# Patient Record
Sex: Female | Born: 1955 | Race: White | Hispanic: No | Marital: Single | Smoking: Never smoker
Health system: Southern US, Community
[De-identification: ages and names within clinical notes are randomized; demographics above are authoritative.]

## PROBLEM LIST (undated history)

## (undated) DIAGNOSIS — M1990 Unspecified osteoarthritis, unspecified site: Secondary | ICD-10-CM

## (undated) DIAGNOSIS — M8580 Other specified disorders of bone density and structure, unspecified site: Secondary | ICD-10-CM

## (undated) DIAGNOSIS — K635 Polyp of colon: Secondary | ICD-10-CM

## (undated) HISTORY — PX: CARPAL TUNNEL RELEASE: SHX101

## (undated) HISTORY — PX: AUGMENTATION MAMMAPLASTY: SUR837

## (undated) HISTORY — PX: FOOT SURGERY: SHX648

## (undated) HISTORY — DX: Other specified disorders of bone density and structure, unspecified site: M85.80

## (undated) HISTORY — PX: TONSILLECTOMY: SUR1361

## (undated) HISTORY — DX: Unspecified osteoarthritis, unspecified site: M19.90

## (undated) HISTORY — DX: Polyp of colon: K63.5

---

## 2002-12-20 ENCOUNTER — Encounter: Payer: Self-pay | Admitting: Internal Medicine

## 2002-12-20 ENCOUNTER — Encounter: Admission: RE | Admit: 2002-12-20 | Discharge: 2002-12-20 | Payer: Self-pay | Admitting: Internal Medicine

## 2003-07-27 ENCOUNTER — Encounter: Payer: Self-pay | Admitting: Internal Medicine

## 2003-07-27 ENCOUNTER — Encounter: Admission: RE | Admit: 2003-07-27 | Discharge: 2003-07-27 | Payer: Self-pay | Admitting: Internal Medicine

## 2004-08-15 ENCOUNTER — Encounter: Admission: RE | Admit: 2004-08-15 | Discharge: 2004-08-15 | Payer: Self-pay | Admitting: Internal Medicine

## 2004-12-19 ENCOUNTER — Other Ambulatory Visit: Admission: RE | Admit: 2004-12-19 | Discharge: 2004-12-19 | Payer: Self-pay | Admitting: Internal Medicine

## 2005-07-17 ENCOUNTER — Encounter: Admission: RE | Admit: 2005-07-17 | Discharge: 2005-07-17 | Payer: Self-pay | Admitting: Internal Medicine

## 2005-12-23 ENCOUNTER — Other Ambulatory Visit: Admission: RE | Admit: 2005-12-23 | Discharge: 2005-12-23 | Payer: Self-pay | Admitting: Internal Medicine

## 2006-07-21 ENCOUNTER — Encounter: Admission: RE | Admit: 2006-07-21 | Discharge: 2006-07-21 | Payer: Self-pay | Admitting: Internal Medicine

## 2007-07-23 ENCOUNTER — Encounter: Admission: RE | Admit: 2007-07-23 | Discharge: 2007-07-23 | Payer: Self-pay | Admitting: Internal Medicine

## 2008-07-24 ENCOUNTER — Ambulatory Visit (HOSPITAL_BASED_OUTPATIENT_CLINIC_OR_DEPARTMENT_OTHER): Admission: RE | Admit: 2008-07-24 | Discharge: 2008-07-24 | Payer: Self-pay | Admitting: Internal Medicine

## 2009-07-25 ENCOUNTER — Ambulatory Visit (HOSPITAL_BASED_OUTPATIENT_CLINIC_OR_DEPARTMENT_OTHER): Admission: RE | Admit: 2009-07-25 | Discharge: 2009-07-25 | Payer: Self-pay | Admitting: Internal Medicine

## 2009-07-25 ENCOUNTER — Ambulatory Visit: Payer: Self-pay | Admitting: Diagnostic Radiology

## 2010-07-29 ENCOUNTER — Ambulatory Visit (HOSPITAL_BASED_OUTPATIENT_CLINIC_OR_DEPARTMENT_OTHER): Admission: RE | Admit: 2010-07-29 | Discharge: 2010-07-29 | Payer: Self-pay | Admitting: Internal Medicine

## 2010-07-29 ENCOUNTER — Ambulatory Visit: Payer: Self-pay | Admitting: Diagnostic Radiology

## 2011-05-29 ENCOUNTER — Other Ambulatory Visit: Payer: Self-pay | Admitting: Internal Medicine

## 2011-05-29 DIAGNOSIS — Z1231 Encounter for screening mammogram for malignant neoplasm of breast: Secondary | ICD-10-CM

## 2011-07-31 ENCOUNTER — Ambulatory Visit (HOSPITAL_BASED_OUTPATIENT_CLINIC_OR_DEPARTMENT_OTHER)
Admission: RE | Admit: 2011-07-31 | Discharge: 2011-07-31 | Disposition: A | Discharge status: Home / Self Care | Payer: Commercial Managed Care - PPO | Source: Ambulatory Visit | Attending: Internal Medicine | Admitting: Internal Medicine

## 2011-07-31 DIAGNOSIS — Z1231 Encounter for screening mammogram for malignant neoplasm of breast: Secondary | ICD-10-CM | POA: Insufficient documentation

## 2012-05-28 ENCOUNTER — Ambulatory Visit: Payer: Commercial Managed Care - PPO | Admitting: Family Medicine

## 2012-06-30 ENCOUNTER — Other Ambulatory Visit: Payer: Self-pay | Admitting: Family Medicine

## 2012-06-30 DIAGNOSIS — Z1231 Encounter for screening mammogram for malignant neoplasm of breast: Secondary | ICD-10-CM

## 2012-07-05 ENCOUNTER — Other Ambulatory Visit (HOSPITAL_COMMUNITY)
Admission: RE | Admit: 2012-07-05 | Discharge: 2012-07-05 | Disposition: A | Discharge status: Home / Self Care | Payer: Commercial Managed Care - PPO | Source: Ambulatory Visit | Attending: Family Medicine | Admitting: Family Medicine

## 2012-07-05 ENCOUNTER — Encounter: Payer: Self-pay | Admitting: Family Medicine

## 2012-07-05 ENCOUNTER — Ambulatory Visit (INDEPENDENT_AMBULATORY_CARE_PROVIDER_SITE_OTHER): Payer: Commercial Managed Care - PPO | Admitting: Family Medicine

## 2012-07-05 VITALS — BP 100/62 | HR 71 | Temp 98.3°F | Ht 62.75 in | Wt 142.2 lb

## 2012-07-05 DIAGNOSIS — Z0000 Encounter for general adult medical examination without abnormal findings: Secondary | ICD-10-CM

## 2012-07-05 DIAGNOSIS — Z13220 Encounter for screening for lipoid disorders: Secondary | ICD-10-CM

## 2012-07-05 DIAGNOSIS — Z01419 Encounter for gynecological examination (general) (routine) without abnormal findings: Secondary | ICD-10-CM | POA: Insufficient documentation

## 2012-07-05 DIAGNOSIS — Z124 Encounter for screening for malignant neoplasm of cervix: Secondary | ICD-10-CM

## 2012-07-05 LAB — CBC WITH DIFFERENTIAL/PLATELET
Basophils Relative: 0.4 % (ref 0.0–3.0)
Eosinophils Absolute: 0.1 10*3/uL (ref 0.0–0.7)
Eosinophils Relative: 1.8 % (ref 0.0–5.0)
Lymphs Abs: 1.6 10*3/uL (ref 0.7–4.0)
MCHC: 33.4 g/dL (ref 30.0–36.0)
MCV: 94.1 fl (ref 78.0–100.0)
Monocytes Absolute: 0.3 10*3/uL (ref 0.1–1.0)
RBC: 4.24 Mil/uL (ref 3.87–5.11)

## 2012-07-05 LAB — POCT URINALYSIS DIPSTICK
Bilirubin, UA: NEGATIVE
Blood, UA: NEGATIVE
Protein, UA: NEGATIVE
Urobilinogen, UA: 0.2

## 2012-07-05 NOTE — Progress Notes (Signed)
Subjective:     Jaime Mitchell is a 56 y.o. female and is here for a comprehensive physical exam. The patient reports no problems.  History   Social History  . Marital Status: Single    Spouse Name: N/A    Number of Children: N/A  . Years of Education: N/A   Occupational History  . medical manager     [employer]   Social History Main Topics  . Smoking status: Never Smoker   . Smokeless tobacco: Never Used  . Alcohol Use: Yes     occ  . Drug Use: No  . Sexually Active: No   Other Topics Concern  . Not on file   Social History Narrative   Exercise-- 4x a week for 1 hour   Health Maintenance  Topic Date Due  . Influenza Vaccine  06/13/2012  . Mammogram  07/30/2013  . Colonoscopy  07/05/2014  . Pap Smear  07/06/2015  . Tetanus/tdap  05/13/2021    The following portions of the patient's history were reviewed and updated as appropriate: She  has a past medical history of Colon polyp; Osteopenia; and Arthritis. She  does not have a problem list on file. She  has past surgical history that includes Cesarean section; Carpal tunnel release; Tonsillectomy; and Foot surgery. Her family history includes Breast cancer in her sister; COPD in her father; Emphysema in her father; Heart disease (age of onset:89) in her mother; and Uterine cancer in her sister. She  reports that she has never smoked. She has never used smokeless tobacco. She reports that she drinks alcohol. She reports that she does not use illicit drugs. She has a current medication list which includes the following prescription(s): risedronate sodium. She is allergic to penicillins..  Review of Systems Review of Systems  Constitutional: Negative for activity change, appetite change and fatigue.  HENT: Negative for hearing loss, congestion, tinnitus and ear discharge.  dentist q58m Eyes: Negative for visual disturbance (see optho q1y -- vision corrected to 20/20 with glasses).  Respiratory: Negative for cough, chest  tightness and shortness of breath.   Cardiovascular: Negative for chest pain, palpitations and leg swelling.  Gastrointestinal: Negative for abdominal pain, diarrhea, constipation and abdominal distention.  Genitourinary: Negative for urgency, frequency, decreased urine volume and difficulty urinating.  Musculoskeletal: Negative for back pain, arthralgias and gait problem.  Skin: Negative for color change, pallor and rash.  Neurological: Negative for dizziness, light-headedness, numbness and headaches.  Hematological: Negative for adenopathy. Does not bruise/bleed easily.  Psychiatric/Behavioral: Negative for suicidal ideas, confusion, sleep disturbance, self-injury, dysphoric mood, decreased concentration and agitation.       Objective:    BP 100/62  Pulse 71  Temp 98.3 F (36.8 C) (Oral)  Ht 5' 2.75" (1.594 m)  Wt 142 lb 3.2 oz (64.501 kg)  BMI 25.39 kg/m2  SpO2 96% General appearance: alert, cooperative, appears stated age and no distress Head: Normocephalic, without obvious abnormality, atraumatic Eyes: conjunctivae/corneas clear. PERRL, EOM's intact. Fundi benign. Ears: normal TM's and external ear canals both ears Nose: Nares normal. Septum midline. Mucosa normal. No drainage or sinus tenderness. Throat: lips, mucosa, and tongue normal; teeth and gums normal Neck: no adenopathy, no carotid bruit, no JVD, supple, symmetrical, trachea midline and thyroid not enlarged, symmetric, no tenderness/mass/nodules Back: symmetric, no curvature. ROM normal. No CVA tenderness. Lungs: clear to auscultation bilaterally Breasts: normal appearance, no masses or tenderness--+ implants Heart: regular rate and rhythm, S1, S2 normal, no murmur, click, rub or gallop Abdomen:  soft, non-tender; bowel sounds normal; no masses,  no organomegaly Pelvic: cervix normal in appearance, no adnexal masses or tenderness, no cervical motion tenderness, rectovaginal septum normal, uterus normal size, shape,  and consistency and vagina normal without discharge---+ scratch on ext genitalia on right side--- pt noticed tender area Extremities: extremities normal, atraumatic, no cyanosis or edema Pulses: 2+ and symmetric Skin: Skin color, texture, turgor normal. No rashes or lesions---toenail an4th fingernail on both hands --lifting  Lymph nodes: Cervical, supraclavicular, and axillary nodes normal. Neurologic: Alert and oriented X 3, normal strength and tone. Normal symmetric reflexes. Normal coordination and gait psych--  no depression,  anxiety    Assessment:    Healthy female exam.      Plan:    check labs Ghm utd See After Visit Summary for Counseling Recommendations

## 2012-07-05 NOTE — Patient Instructions (Addendum)
Preventive Care for Adults, Female A healthy lifestyle and preventive care can promote health and wellness. Preventive health guidelines for women include the following key practices.  A routine yearly physical is a good way to check with your caregiver about your health and preventive screening. It is a chance to share any concerns and updates on your health, and to receive a thorough exam.   Visit your dentist for a routine exam and preventive care every 6 months. Brush your teeth twice a day and floss once a day. Good oral hygiene prevents tooth decay and gum disease.   The frequency of eye exams is based on your age, health, family medical history, use of contact lenses, and other factors. Follow your caregiver's recommendations for frequency of eye exams.   Eat a healthy diet. Foods like vegetables, fruits, whole grains, low-fat dairy products, and lean protein foods contain the nutrients you need without too many calories. Decrease your intake of foods high in solid fats, added sugars, and salt. Eat the right amount of calories for you.Get information about a proper diet from your caregiver, if necessary.   Regular physical exercise is one of the most important things you can do for your health. Most adults should get at least 150 minutes of moderate-intensity exercise (any activity that increases your heart rate and causes you to sweat) each week. In addition, most adults need muscle-strengthening exercises on 2 or more days a week.   Maintain a healthy weight. The body mass index (BMI) is a screening tool to identify possible weight problems. It provides an estimate of body fat based on height and weight. Your caregiver can help determine your BMI, and can help you achieve or maintain a healthy weight.For adults 20 years and older:   A BMI below 18.5 is considered underweight.   A BMI of 18.5 to 24.9 is normal.   A BMI of 25 to 29.9 is considered overweight.   A BMI of 30 and above is  considered obese.   Maintain normal blood lipids and cholesterol levels by exercising and minimizing your intake of saturated fat. Eat a balanced diet with plenty of fruit and vegetables. Blood tests for lipids and cholesterol should begin at age 20 and be repeated every 5 years. If your lipid or cholesterol levels are high, you are over 50, or you are at high risk for heart disease, you may need your cholesterol levels checked more frequently.Ongoing high lipid and cholesterol levels should be treated with medicines if diet and exercise are not effective.   If you smoke, find out from your caregiver how to quit. If you do not use tobacco, do not start.   If you are pregnant, do not drink alcohol. If you are breastfeeding, be very cautious about drinking alcohol. If you are not pregnant and choose to drink alcohol, do not exceed 1 drink per day. One drink is considered to be 12 ounces (355 mL) of beer, 5 ounces (148 mL) of wine, or 1.5 ounces (44 mL) of liquor.   Avoid use of street drugs. Do not share needles with anyone. Ask for help if you need support or instructions about stopping the use of drugs.   High blood pressure causes heart disease and increases the risk of stroke. Your blood pressure should be checked at least every 1 to 2 years. Ongoing high blood pressure should be treated with medicines if weight loss and exercise are not effective.   If you are 55 to 57   years old, ask your caregiver if you should take aspirin to prevent strokes.   Diabetes screening involves taking a blood sample to check your fasting blood sugar level. This should be done once every 3 years, after age 45, if you are within normal weight and without risk factors for diabetes. Testing should be considered at a younger age or be carried out more frequently if you are overweight and have at least 1 risk factor for diabetes.   Breast cancer screening is essential preventive care for women. You should practice "breast  self-awareness." This means understanding the normal appearance and feel of your breasts and may include breast self-examination. Any changes detected, no matter how small, should be reported to a caregiver. Women in their 20s and 30s should have a clinical breast exam (CBE) by a caregiver as part of a regular health exam every 1 to 3 years. After age 40, women should have a CBE every year. Starting at age 40, women should consider having a mammography (breast X-ray test) every year. Women who have a family history of breast cancer should talk to their caregiver about genetic screening. Women at a high risk of breast cancer should talk to their caregivers about having magnetic resonance imaging (MRI) and a mammography every year.   The Pap test is a screening test for cervical cancer. A Pap test can show cell changes on the cervix that might become cervical cancer if left untreated. A Pap test is a procedure in which cells are obtained and examined from the lower end of the uterus (cervix).   Women should have a Pap test starting at age 21.   Between ages 21 and 29, Pap tests should be repeated every 2 years.   Beginning at age 30, you should have a Pap test every 3 years as long as the past 3 Pap tests have been normal.   Some women have medical problems that increase the chance of getting cervical cancer. Talk to your caregiver about these problems. It is especially important to talk to your caregiver if a new problem develops soon after your last Pap test. In these cases, your caregiver may recommend more frequent screening and Pap tests.   The above recommendations are the same for women who have or have not gotten the vaccine for human papillomavirus (HPV).   If you had a hysterectomy for a problem that was not cancer or a condition that could lead to cancer, then you no longer need Pap tests. Even if you no longer need a Pap test, a regular exam is a good idea to make sure no other problems are  starting.   If you are between ages 65 and 70, and you have had normal Pap tests going back 10 years, you no longer need Pap tests. Even if you no longer need a Pap test, a regular exam is a good idea to make sure no other problems are starting.   If you have had past treatment for cervical cancer or a condition that could lead to cancer, you need Pap tests and screening for cancer for at least 20 years after your treatment.   If Pap tests have been discontinued, risk factors (such as a new sexual partner) need to be reassessed to determine if screening should be resumed.   The HPV test is an additional test that may be used for cervical cancer screening. The HPV test looks for the virus that can cause the cell changes on the cervix.   The cells collected during the Pap test can be tested for HPV. The HPV test could be used to screen women aged 30 years and older, and should be used in women of any age who have unclear Pap test results. After the age of 30, women should have HPV testing at the same frequency as a Pap test.   Colorectal cancer can be detected and often prevented. Most routine colorectal cancer screening begins at the age of 50 and continues through age 75. However, your caregiver may recommend screening at an earlier age if you have risk factors for colon cancer. On a yearly basis, your caregiver may provide home test kits to check for hidden blood in the stool. Use of a small camera at the end of a tube, to directly examine the colon (sigmoidoscopy or colonoscopy), can detect the earliest forms of colorectal cancer. Talk to your caregiver about this at age 50, when routine screening begins. Direct examination of the colon should be repeated every 5 to 10 years through age 75, unless early forms of pre-cancerous polyps or small growths are found.   Hepatitis C blood testing is recommended for all people born from 1945 through 1965 and any individual with known risks for hepatitis C.    Practice safe sex. Use condoms and avoid high-risk sexual practices to reduce the spread of sexually transmitted infections (STIs). STIs include gonorrhea, chlamydia, syphilis, trichomonas, herpes, HPV, and human immunodeficiency virus (HIV). Herpes, HIV, and HPV are viral illnesses that have no cure. They can result in disability, cancer, and death. Sexually active women aged 25 and younger should be checked for chlamydia. Older women with new or multiple partners should also be tested for chlamydia. Testing for other STIs is recommended if you are sexually active and at increased risk.   Osteoporosis is a disease in which the bones lose minerals and strength with aging. This can result in serious bone fractures. The risk of osteoporosis can be identified using a bone density scan. Women ages 65 and over and women at risk for fractures or osteoporosis should discuss screening with their caregivers. Ask your caregiver whether you should take a calcium supplement or vitamin D to reduce the rate of osteoporosis.   Menopause can be associated with physical symptoms and risks. Hormone replacement therapy is available to decrease symptoms and risks. You should talk to your caregiver about whether hormone replacement therapy is right for you.   Use sunscreen with sun protection factor (SPF) of 30 or more. Apply sunscreen liberally and repeatedly throughout the day. You should seek shade when your shadow is shorter than you. Protect yourself by wearing long sleeves, pants, a wide-brimmed hat, and sunglasses year round, whenever you are outdoors.   Once a month, do a whole body skin exam, using a mirror to look at the skin on your back. Notify your caregiver of new moles, moles that have irregular borders, moles that are larger than a pencil eraser, or moles that have changed in shape or color.   Stay current with required immunizations.   Influenza. You need a dose every fall (or winter). The composition of  the flu vaccine changes each year, so being vaccinated once is not enough.   Pneumococcal polysaccharide. You need 1 to 2 doses if you smoke cigarettes or if you have certain chronic medical conditions. You need 1 dose at age 65 (or older) if you have never been vaccinated.   Tetanus, diphtheria, pertussis (Tdap, Td). Get 1 dose of   Tdap vaccine if you are younger than age 65, are over 65 and have contact with an infant, are a healthcare worker, are pregnant, or simply want to be protected from whooping cough. After that, you need a Td booster dose every 10 years. Consult your caregiver if you have not had at least 3 tetanus and diphtheria-containing shots sometime in your life or have a deep or dirty wound.   HPV. You need this vaccine if you are a woman age 26 or younger. The vaccine is given in 3 doses over 6 months.   Measles, mumps, rubella (MMR). You need at least 1 dose of MMR if you were born in 1957 or later. You may also need a second dose.   Meningococcal. If you are age 19 to 21 and a first-year college student living in a residence hall, or have one of several medical conditions, you need to get vaccinated against meningococcal disease. You may also need additional booster doses.   Zoster (shingles). If you are age 60 or older, you should get this vaccine.   Varicella (chickenpox). If you have never had chickenpox or you were vaccinated but received only 1 dose, talk to your caregiver to find out if you need this vaccine.   Hepatitis A. You need this vaccine if you have a specific risk factor for hepatitis A virus infection or you simply wish to be protected from this disease. The vaccine is usually given as 2 doses, 6 to 18 months apart.   Hepatitis B. You need this vaccine if you have a specific risk factor for hepatitis B virus infection or you simply wish to be protected from this disease. The vaccine is given in 3 doses, usually over 6 months.  Preventive Services /  Frequency Ages 19 to 39  Blood pressure check.** / Every 1 to 2 years.   Lipid and cholesterol check.** / Every 5 years beginning at age 20.   Clinical breast exam.** / Every 3 years for women in their 20s and 30s.   Pap test.** / Every 2 years from ages 21 through 29. Every 3 years starting at age 30 through age 65 or 70 with a history of 3 consecutive normal Pap tests.   HPV screening.** / Every 3 years from ages 30 through ages 65 to 70 with a history of 3 consecutive normal Pap tests.   Hepatitis C blood test.** / For any individual with known risks for hepatitis C.   Skin self-exam. / Monthly.   Influenza immunization.** / Every year.   Pneumococcal polysaccharide immunization.** / 1 to 2 doses if you smoke cigarettes or if you have certain chronic medical conditions.   Tetanus, diphtheria, pertussis (Tdap, Td) immunization. / A one-time dose of Tdap vaccine. After that, you need a Td booster dose every 10 years.   HPV immunization. / 3 doses over 6 months, if you are 26 and younger.   Measles, mumps, rubella (MMR) immunization. / You need at least 1 dose of MMR if you were born in 1957 or later. You may also need a second dose.   Meningococcal immunization. / 1 dose if you are age 19 to 21 and a first-year college student living in a residence hall, or have one of several medical conditions, you need to get vaccinated against meningococcal disease. You may also need additional booster doses.   Varicella immunization.** / Consult your caregiver.   Hepatitis A immunization.** / Consult your caregiver. 2 doses, 6 to 18 months   apart.   Hepatitis B immunization.** / Consult your caregiver. 3 doses usually over 6 months.  Ages 40 to 64  Blood pressure check.** / Every 1 to 2 years.   Lipid and cholesterol check.** / Every 5 years beginning at age 20.   Clinical breast exam.** / Every year after age 40.   Mammogram.** / Every year beginning at age 40 and continuing for as  long as you are in good health. Consult with your caregiver.   Pap test.** / Every 3 years starting at age 30 through age 65 or 70 with a history of 3 consecutive normal Pap tests.   HPV screening.** / Every 3 years from ages 30 through ages 65 to 70 with a history of 3 consecutive normal Pap tests.   Fecal occult blood test (FOBT) of stool. / Every year beginning at age 50 and continuing until age 75. You may not need to do this test if you get a colonoscopy every 10 years.   Flexible sigmoidoscopy or colonoscopy.** / Every 5 years for a flexible sigmoidoscopy or every 10 years for a colonoscopy beginning at age 50 and continuing until age 75.   Hepatitis C blood test.** / For all people born from 1945 through 1965 and any individual with known risks for hepatitis C.   Skin self-exam. / Monthly.   Influenza immunization.** / Every year.   Pneumococcal polysaccharide immunization.** / 1 to 2 doses if you smoke cigarettes or if you have certain chronic medical conditions.   Tetanus, diphtheria, pertussis (Tdap, Td) immunization.** / A one-time dose of Tdap vaccine. After that, you need a Td booster dose every 10 years.   Measles, mumps, rubella (MMR) immunization. / You need at least 1 dose of MMR if you were born in 1957 or later. You may also need a second dose.   Varicella immunization.** / Consult your caregiver.   Meningococcal immunization.** / Consult your caregiver.   Hepatitis A immunization.** / Consult your caregiver. 2 doses, 6 to 18 months apart.   Hepatitis B immunization.** / Consult your caregiver. 3 doses, usually over 6 months.  Ages 65 and over  Blood pressure check.** / Every 1 to 2 years.   Lipid and cholesterol check.** / Every 5 years beginning at age 20.   Clinical breast exam.** / Every year after age 40.   Mammogram.** / Every year beginning at age 40 and continuing for as long as you are in good health. Consult with your caregiver.   Pap test.** /  Every 3 years starting at age 30 through age 65 or 70 with a 3 consecutive normal Pap tests. Testing can be stopped between 65 and 70 with 3 consecutive normal Pap tests and no abnormal Pap or HPV tests in the past 10 years.   HPV screening.** / Every 3 years from ages 30 through ages 65 or 70 with a history of 3 consecutive normal Pap tests. Testing can be stopped between 65 and 70 with 3 consecutive normal Pap tests and no abnormal Pap or HPV tests in the past 10 years.   Fecal occult blood test (FOBT) of stool. / Every year beginning at age 50 and continuing until age 75. You may not need to do this test if you get a colonoscopy every 10 years.   Flexible sigmoidoscopy or colonoscopy.** / Every 5 years for a flexible sigmoidoscopy or every 10 years for a colonoscopy beginning at age 50 and continuing until age 75.   Hepatitis   C blood test.** / For all people born from 1945 through 1965 and any individual with known risks for hepatitis C.   Osteoporosis screening.** / A one-time screening for women ages 65 and over and women at risk for fractures or osteoporosis.   Skin self-exam. / Monthly.   Influenza immunization.** / Every year.   Pneumococcal polysaccharide immunization.** / 1 dose at age 65 (or older) if you have never been vaccinated.   Tetanus, diphtheria, pertussis (Tdap, Td) immunization. / A one-time dose of Tdap vaccine if you are over 65 and have contact with an infant, are a healthcare worker, or simply want to be protected from whooping cough. After that, you need a Td booster dose every 10 years.   Varicella immunization.** / Consult your caregiver.   Meningococcal immunization.** / Consult your caregiver.   Hepatitis A immunization.** / Consult your caregiver. 2 doses, 6 to 18 months apart.   Hepatitis B immunization.** / Check with your caregiver. 3 doses, usually over 6 months.  ** Family history and personal history of risk and conditions may change your caregiver's  recommendations. Document Released: 11/25/2001 Document Revised: 09/18/2011 Document Reviewed: 02/24/2011 [employer] Patient Information 2012 [employer]. 

## 2012-07-06 LAB — HEPATIC FUNCTION PANEL
ALT: 18 U/L (ref 0–35)
AST: 20 U/L (ref 0–37)
Bilirubin, Direct: 0.1 mg/dL (ref 0.0–0.3)
Total Protein: 7.2 g/dL (ref 6.0–8.3)

## 2012-07-06 LAB — BASIC METABOLIC PANEL
CO2: 28 mEq/L (ref 19–32)
Creatinine, Ser: 0.7 mg/dL (ref 0.4–1.2)
Potassium: 3.8 mEq/L (ref 3.5–5.1)
Sodium: 139 mEq/L (ref 135–145)

## 2012-07-06 LAB — LIPID PANEL
Cholesterol: 256 mg/dL — ABNORMAL HIGH (ref 0–200)
Triglycerides: 51 mg/dL (ref 0.0–149.0)

## 2012-07-06 LAB — LDL CHOLESTEROL, DIRECT: Direct LDL: 128.8 mg/dL

## 2012-08-02 ENCOUNTER — Ambulatory Visit (HOSPITAL_BASED_OUTPATIENT_CLINIC_OR_DEPARTMENT_OTHER)
Admission: RE | Admit: 2012-08-02 | Discharge: 2012-08-02 | Disposition: A | Discharge status: Home / Self Care | Payer: Commercial Managed Care - PPO | Source: Ambulatory Visit | Attending: Family Medicine | Admitting: Family Medicine

## 2012-08-02 DIAGNOSIS — Z1231 Encounter for screening mammogram for malignant neoplasm of breast: Secondary | ICD-10-CM | POA: Insufficient documentation

## 2012-11-04 ENCOUNTER — Encounter: Payer: Self-pay | Admitting: Family Medicine

## 2012-11-27 ENCOUNTER — Other Ambulatory Visit: Payer: Self-pay

## 2012-12-03 ENCOUNTER — Encounter: Payer: Self-pay | Admitting: Family Medicine

## 2013-05-09 ENCOUNTER — Encounter: Payer: Self-pay | Admitting: Family Medicine

## 2013-05-11 ENCOUNTER — Other Ambulatory Visit: Payer: Self-pay | Admitting: Family Medicine

## 2013-05-11 DIAGNOSIS — Z1231 Encounter for screening mammogram for malignant neoplasm of breast: Secondary | ICD-10-CM

## 2013-08-03 ENCOUNTER — Ambulatory Visit (HOSPITAL_BASED_OUTPATIENT_CLINIC_OR_DEPARTMENT_OTHER)
Admission: RE | Admit: 2013-08-03 | Discharge: 2013-08-03 | Disposition: A | Discharge status: Home / Self Care | Payer: Commercial Managed Care - PPO | Source: Ambulatory Visit | Attending: Family Medicine | Admitting: Family Medicine

## 2013-08-03 DIAGNOSIS — Z1231 Encounter for screening mammogram for malignant neoplasm of breast: Secondary | ICD-10-CM

## 2013-08-18 ENCOUNTER — Other Ambulatory Visit: Payer: Self-pay

## 2014-03-31 ENCOUNTER — Telehealth: Payer: Self-pay

## 2014-03-31 NOTE — Telephone Encounter (Addendum)
No answer.  Unable to leave a voice message.  No voice mail box setup.  Work and mobile numbers listed are invalid.

## 2014-04-03 ENCOUNTER — Ambulatory Visit (INDEPENDENT_AMBULATORY_CARE_PROVIDER_SITE_OTHER): Payer: PRIVATE HEALTH INSURANCE | Admitting: Family Medicine

## 2014-04-03 ENCOUNTER — Encounter: Payer: Self-pay | Admitting: Family Medicine

## 2014-04-03 ENCOUNTER — Other Ambulatory Visit (HOSPITAL_COMMUNITY)
Admission: RE | Admit: 2014-04-03 | Discharge: 2014-04-03 | Disposition: A | Discharge status: Home / Self Care | Payer: PRIVATE HEALTH INSURANCE | Source: Ambulatory Visit | Attending: Family Medicine | Admitting: Family Medicine

## 2014-04-03 VITALS — BP 100/68 | HR 67 | Temp 97.5°F | Ht 62.75 in | Wt 146.0 lb

## 2014-04-03 DIAGNOSIS — Z124 Encounter for screening for malignant neoplasm of cervix: Secondary | ICD-10-CM

## 2014-04-03 DIAGNOSIS — R809 Proteinuria, unspecified: Secondary | ICD-10-CM

## 2014-04-03 DIAGNOSIS — Z01419 Encounter for gynecological examination (general) (routine) without abnormal findings: Secondary | ICD-10-CM | POA: Insufficient documentation

## 2014-04-03 DIAGNOSIS — Z0000 Encounter for general adult medical examination without abnormal findings: Secondary | ICD-10-CM

## 2014-04-03 DIAGNOSIS — Z1151 Encounter for screening for human papillomavirus (HPV): Secondary | ICD-10-CM | POA: Insufficient documentation

## 2014-04-03 LAB — LIPID PANEL
CHOL/HDL RATIO: 2
Cholesterol: 214 mg/dL — ABNORMAL HIGH (ref 0–200)
HDL: 92.4 mg/dL (ref >39.00)
LDL Cholesterol: 108 mg/dL — ABNORMAL HIGH (ref 0–99)
NonHDL: 121.6
TRIGLYCERIDES: 68 mg/dL (ref 0.0–149.0)
VLDL: 13.6 mg/dL (ref 0.0–40.0)

## 2014-04-03 LAB — BASIC METABOLIC PANEL
BUN: 15 mg/dL (ref 6–23)
CO2: 31 meq/L (ref 19–32)
CREATININE: 0.7 mg/dL (ref 0.4–1.2)
Calcium: 9.4 mg/dL (ref 8.4–10.5)
Chloride: 100 mEq/L (ref 96–112)
GFR: 86.63 mL/min (ref >60.00)
GLUCOSE: 84 mg/dL (ref 70–99)
Potassium: 3.8 mEq/L (ref 3.5–5.1)
Sodium: 138 mEq/L (ref 135–145)

## 2014-04-03 LAB — CBC WITH DIFFERENTIAL/PLATELET
Basophils Absolute: 0 10*3/uL (ref 0.0–0.1)
Basophils Relative: 0.7 % (ref 0.0–3.0)
EOS PCT: 2.3 % (ref 0.0–5.0)
Eosinophils Absolute: 0.1 10*3/uL (ref 0.0–0.7)
HEMATOCRIT: 39.1 % (ref 36.0–46.0)
Hemoglobin: 13.3 g/dL (ref 12.0–15.0)
LYMPHS ABS: 1.4 10*3/uL (ref 0.7–4.0)
LYMPHS PCT: 41 % (ref 12.0–46.0)
MCHC: 34 g/dL (ref 30.0–36.0)
MCV: 95.7 fl (ref 78.0–100.0)
MONOS PCT: 8.2 % (ref 3.0–12.0)
Monocytes Absolute: 0.3 10*3/uL (ref 0.1–1.0)
NEUTROS ABS: 1.6 10*3/uL (ref 1.4–7.7)
Neutrophils Relative %: 47.8 % (ref 43.0–77.0)
PLATELETS: 223 10*3/uL (ref 150.0–400.0)
RBC: 4.09 Mil/uL (ref 3.87–5.11)
RDW: 13.6 % (ref 11.5–15.5)
WBC: 3.4 10*3/uL — AB (ref 4.0–10.5)

## 2014-04-03 LAB — HEPATIC FUNCTION PANEL
ALT: 16 U/L (ref 0–35)
AST: 21 U/L (ref 0–37)
Albumin: 4.6 g/dL (ref 3.5–5.2)
Alkaline Phosphatase: 40 U/L (ref 39–117)
Bilirubin, Direct: 0.1 mg/dL (ref 0.0–0.3)
TOTAL PROTEIN: 6.9 g/dL (ref 6.0–8.3)
Total Bilirubin: 0.9 mg/dL (ref 0.2–1.2)

## 2014-04-03 LAB — TSH: TSH: 1.07 u[IU]/mL (ref 0.35–4.50)

## 2014-04-03 NOTE — Progress Notes (Signed)
Pre visit review using our clinic review tool, if applicable. No additional management support is needed unless otherwise documented below in the visit note. 

## 2014-04-03 NOTE — Progress Notes (Signed)
Subjective:     Jaime Mitchell is a 58 y.o. female and is here for a comprehensive physical exam. The patient reports no problems.  History   Social History  . Marital Status: Single    Spouse Name: N/A    Number of Children: N/A  . Years of Education: N/A   Occupational History  . medical manager     [employer]   Social History Main Topics  . Smoking status: Never Smoker   . Smokeless tobacco: Never Used  . Alcohol Use: Yes     Comment: occ  . Drug Use: No  . Sexual Activity: No   Other Topics Concern  . Not on file   Social History Narrative   Exercise-- 4x a week for 1 hour   Health Maintenance  Topic Date Due  . Influenza Vaccine  05/13/2014  . Colonoscopy  07/05/2014  . Pap Smear  07/06/2015  . Mammogram  08/04/2015  . Tetanus/tdap  05/13/2021    The following portions of the patient's history were reviewed and updated as appropriate:  She  has a past medical history of Colon polyp; Osteopenia; and Arthritis. She  does not have a problem list on file. She  has past surgical history that includes Cesarean section; Carpal tunnel release; Tonsillectomy; and Foot surgery. Her family history includes Breast cancer in her sister; COPD in her father; Emphysema in her father; Heart disease (age of onset: 1789) in her mother; Uterine cancer in her sister. She  reports that she has never smoked. She has never used smokeless tobacco. She reports that she drinks alcohol. She reports that she does not use illicit drugs. She has a current medication list which includes the following prescription(s): risedronate sodium. Current Outpatient Prescriptions on File Prior to Visit  Medication Sig Dispense Refill  . Risedronate Sodium (ATELVIA) 35 MG TBEC Take 1 tablet by mouth once a week.        No current facility-administered medications on file prior to visit.   She is allergic to penicillins..  Review of Systems Review of Systems  Constitutional: Negative for activity  change, appetite change and fatigue.  HENT: Negative for hearing loss, congestion, tinnitus and ear discharge.  dentist [id number] Eyes: Negative for visual disturbance (see optho q2y -- vision corrected to 20/20 with glasses).  Respiratory: Negative for cough, chest tightness and shortness of breath.   Cardiovascular: Negative for chest pain, palpitations and leg swelling.  Gastrointestinal: Negative for abdominal pain, diarrhea, constipation and abdominal distention.  Genitourinary: Negative for urgency, frequency, decreased urine volume and difficulty urinating.  Musculoskeletal: Negative for back pain, arthralgias and gait problem.  Skin: Negative for color change, pallor and rash.  Neurological: Negative for dizziness, light-headedness, numbness and headaches.  Hematological: Negative for adenopathy. Does not bruise/bleed easily.  Psychiatric/Behavioral: Negative for suicidal ideas, confusion, sleep disturbance, self-injury, dysphoric mood, decreased concentration and agitation.       Objective:    BP 100/68  Pulse 67  Temp(Src) 97.5 F (36.4 C) (Oral)  Ht 5' 2.75" (1.594 m)  Wt 146 lb (66.225 kg)  BMI 26.06 kg/m2  SpO2 97% General appearance: alert, cooperative, appears stated age and no distress Head: Normocephalic, without obvious abnormality, atraumatic Eyes: conjunctivae/corneas clear. PERRL, EOM's intact. Fundi benign. Ears: normal TM's and external ear canals both ears Nose: Nares normal. Septum midline. Mucosa normal. No drainage or sinus tenderness. Throat: lips, mucosa, and tongue normal; teeth and gums normal Neck: no adenopathy, supple, symmetrical, trachea midline and thyroid  not enlarged, symmetric, no tenderness/mass/nodules Back: symmetric, no curvature. ROM normal. No CVA tenderness. Lungs: clear to auscultation bilaterally Breasts: normal appearance, no masses or tenderness Heart: regular rate and rhythm, S1, S2 normal, no murmur, click, rub or gallop Abdomen:  soft, non-tender; bowel sounds normal; no masses,  no organomegaly Pelvic: cervix normal in appearance, external genitalia normal, no adnexal masses or tenderness, no cervical motion tenderness, rectovaginal septum normal, uterus normal size, shape, and consistency, vagina normal without discharge and pap done Extremities: extremities normal, atraumatic, no cyanosis or edema Pulses: 2+ and symmetric Skin: Skin color, texture, turgor normal. No rashes or lesions Lymph nodes: Cervical, supraclavicular, and axillary nodes normal. Neurologic: Alert and oriented X 3, normal strength and tone. Normal symmetric reflexes. Normal coordination and gait Psych- no depression, no anxiety      Assessment:    Healthy female exam.      Plan:    ghm utd Check labs See After Visit Summary for Counseling Recommendations   1. Preventative health care  - Basic metabolic panel - CBC with Differential - Hepatic function panel - Lipid panel - POCT urinalysis dipstick - TSH - Cytology - PAP  2. Screening for malignant neoplasm of the cervix  - Cytology - PAP

## 2014-04-03 NOTE — Patient Instructions (Signed)
Preventive Care for Adults A healthy lifestyle and preventive care can promote health and wellness. Preventive health guidelines for women include the following key practices.  A routine yearly physical is a good way to check with your health care provider about your health and preventive screening. It is a chance to share any concerns and updates on your health and to receive a thorough exam.  Visit your dentist for a routine exam and preventive care every 6 months. Brush your teeth twice a day and floss once a day. Good oral hygiene prevents tooth decay and gum disease.  The frequency of eye exams is based on your age, health, family medical history, use of contact lenses, and other factors. Follow your health care provider's recommendations for frequency of eye exams.  Eat a healthy diet. Foods like vegetables, fruits, whole grains, low-fat dairy products, and lean protein foods contain the nutrients you need without too many calories. Decrease your intake of foods high in solid fats, added sugars, and salt. Eat the right amount of calories for you.Get information about a proper diet from your health care provider, if necessary.  Regular physical exercise is one of the most important things you can do for your health. Most adults should get at least 150 minutes of moderate-intensity exercise (any activity that increases your heart rate and causes you to sweat) each week. In addition, most adults need muscle-strengthening exercises on 2 or more days a week.  Maintain a healthy weight. The body mass index (BMI) is a screening tool to identify possible weight problems. It provides an estimate of body fat based on height and weight. Your health care provider can find your BMI, and can help you achieve or maintain a healthy weight.For adults 20 years and older:  A BMI below 18.5 is considered underweight.  A BMI of 18.5 to 24.9 is normal.  A BMI of 25 to 29.9 is considered overweight.  A BMI of  30 and above is considered obese.  Maintain normal blood lipids and cholesterol levels by exercising and minimizing your intake of saturated fat. Eat a balanced diet with plenty of fruit and vegetables. Blood tests for lipids and cholesterol should begin at age 52 and be repeated every 5 years. If your lipid or cholesterol levels are high, you are over 50, or you are at high risk for heart disease, you may need your cholesterol levels checked more frequently.Ongoing high lipid and cholesterol levels should be treated with medicines if diet and exercise are not working.  If you smoke, find out from your health care provider how to quit. If you do not use tobacco, do not start.  Lung cancer screening is recommended for adults aged 37-80 years who are at high risk for developing lung cancer because of a history of smoking. A yearly low-dose CT scan of the lungs is recommended for people who have at least a 30-pack-year history of smoking and are a current smoker or have quit within the past 15 years. A pack year of smoking is smoking an average of 1 pack of cigarettes a day for 1 year (for example: 1 pack a day for 30 years or 2 packs a day for 15 years). Yearly screening should continue until the smoker has stopped smoking for at least 15 years. Yearly screening should be stopped for people who develop a health problem that would prevent them from having lung cancer treatment.  If you are pregnant, do not drink alcohol. If you are breastfeeding,  be very cautious about drinking alcohol. If you are not pregnant and choose to drink alcohol, do not have more than 1 drink per day. One drink is considered to be 12 ounces (355 mL) of beer, 5 ounces (148 mL) of wine, or 1.5 ounces (44 mL) of liquor.  Avoid use of street drugs. Do not share needles with anyone. Ask for help if you need support or instructions about stopping the use of drugs.  High blood pressure causes heart disease and increases the risk of  stroke. Your blood pressure should be checked at least every 1 to 2 years. Ongoing high blood pressure should be treated with medicines if weight loss and exercise do not work.  If you are 75-52 years old, ask your health care provider if you should take aspirin to prevent strokes.  Diabetes screening involves taking a blood sample to check your fasting blood sugar level. This should be done once every 3 years, after age 15, if you are within normal weight and without risk factors for diabetes. Testing should be considered at a younger age or be carried out more frequently if you are overweight and have at least 1 risk factor for diabetes.  Breast cancer screening is essential preventive care for women. You should practice "breast self-awareness." This means understanding the normal appearance and feel of your breasts and may include breast self-examination. Any changes detected, no matter how small, should be reported to a health care provider. Women in their 58s and 30s should have a clinical breast exam (CBE) by a health care provider as part of a regular health exam every 1 to 3 years. After age 16, women should have a CBE every year. Starting at age 53, women should consider having a mammogram (breast X-ray test) every year. Women who have a family history of breast cancer should talk to their health care provider about genetic screening. Women at a high risk of breast cancer should talk to their health care providers about having an MRI and a mammogram every year.  Breast cancer gene (BRCA)-related cancer risk assessment is recommended for women who have family members with BRCA-related cancers. BRCA-related cancers include breast, ovarian, tubal, and peritoneal cancers. Having family members with these cancers may be associated with an increased risk for harmful changes (mutations) in the breast cancer genes BRCA1 and BRCA2. Results of the assessment will determine the need for genetic counseling and  BRCA1 and BRCA2 testing.  Routine pelvic exams to screen for cancer are no longer recommended for nonpregnant women who are considered low risk for cancer of the pelvic organs (ovaries, uterus, and vagina) and who do not have symptoms. Ask your health care provider if a screening pelvic exam is right for you.  If you have had past treatment for cervical cancer or a condition that could lead to cancer, you need Pap tests and screening for cancer for at least 20 years after your treatment. If Pap tests have been discontinued, your risk factors (such as having a new sexual partner) need to be reassessed to determine if screening should be resumed. Some women have medical problems that increase the chance of getting cervical cancer. In these cases, your health care provider may recommend more frequent screening and Pap tests.  The HPV test is an additional test that may be used for cervical cancer screening. The HPV test looks for the virus that can cause the cell changes on the cervix. The cells collected during the Pap test can be  tested for HPV. The HPV test could be used to screen women aged 47 years and older, and should be used in women of any age who have unclear Pap test results. After the age of 36, women should have HPV testing at the same frequency as a Pap test.  Colorectal cancer can be detected and often prevented. Most routine colorectal cancer screening begins at the age of 38 years and continues through age 58 years. However, your health care provider may recommend screening at an earlier age if you have risk factors for colon cancer. On a yearly basis, your health care provider may provide home test kits to check for hidden blood in the stool. Use of a small camera at the end of a tube, to directly examine the colon (sigmoidoscopy or colonoscopy), can detect the earliest forms of colorectal cancer. Talk to your health care provider about this at age 64, when routine screening begins. Direct  exam of the colon should be repeated every 5-10 years through age 21 years, unless early forms of pre-cancerous polyps or small growths are found.  People who are at an increased risk for hepatitis B should be screened for this virus. You are considered at high risk for hepatitis B if:  You were born in a country where hepatitis B occurs often. Talk with your health care provider about which countries are considered high risk.  Your parents were born in a high-risk country and you have not received a shot to protect against hepatitis B (hepatitis B vaccine).  You have HIV or AIDS.  You use needles to inject street drugs.  You live with, or have sex with, someone who has Hepatitis B.  You get hemodialysis treatment.  You take certain medicines for conditions like cancer, organ transplantation, and autoimmune conditions.  Hepatitis C blood testing is recommended for all people born from [birth year] through 1965 and any individual with known risks for hepatitis C.  Practice safe sex. Use condoms and avoid high-risk sexual practices to reduce the spread of sexually transmitted infections (STIs). STIs include gonorrhea, chlamydia, syphilis, trichomonas, herpes, HPV, and human immunodeficiency virus (HIV). Herpes, HIV, and HPV are viral illnesses that have no cure. They can result in disability, cancer, and death.  You should be screened for sexually transmitted illnesses (STIs) including gonorrhea and chlamydia if:  You are sexually active and are younger than 24 years.  You are older than 24 years and your health care provider tells you that you are at risk for this type of infection.  Your sexual activity has changed since you were last screened and you are at an increased risk for chlamydia or gonorrhea. Ask your health care provider if you are at risk.  If you are at risk of being infected with HIV, it is recommended that you take a prescription medicine daily to prevent HIV infection. This is  called preexposure prophylaxis (PrEP). You are considered at risk if:  You are a heterosexual woman, are sexually active, and are at increased risk for HIV infection.  You take drugs by injection.  You are sexually active with a partner who has HIV.  Talk with your health care provider about whether you are at high risk of being infected with HIV. If you choose to begin PrEP, you should first be tested for HIV. You should then be tested every 3 months for as long as you are taking PrEP.  Osteoporosis is a disease in which the bones lose minerals and strength  with aging. This can result in serious bone fractures or breaks. The risk of osteoporosis can be identified using a bone density scan. Women ages 65 years and over and women at risk for fractures or osteoporosis should discuss screening with their health care providers. Ask your health care provider whether you should take a calcium supplement or vitamin D to reduce the rate of osteoporosis.  Menopause can be associated with physical symptoms and risks. Hormone replacement therapy is available to decrease symptoms and risks. You should talk to your health care provider about whether hormone replacement therapy is right for you.  Use sunscreen. Apply sunscreen liberally and repeatedly throughout the day. You should seek shade when your shadow is shorter than you. Protect yourself by wearing long sleeves, pants, a wide-brimmed hat, and sunglasses year round, whenever you are outdoors.  Once a month, do a whole body skin exam, using a mirror to look at the skin on your back. Tell your health care provider of new moles, moles that have irregular borders, moles that are larger than a pencil eraser, or moles that have changed in shape or color.  Stay current with required vaccines (immunizations).  Influenza vaccine. All adults should be immunized every year.  Tetanus, diphtheria, and acellular pertussis (Td, Tdap) vaccine. Pregnant women should  receive 1 dose of Tdap vaccine during each pregnancy. The dose should be obtained regardless of the length of time since the last dose. Immunization is preferred during the 27th-36th week of gestation. An adult who has not previously received Tdap or who does not know her vaccine status should receive 1 dose of Tdap. This initial dose should be followed by tetanus and diphtheria toxoids (Td) booster doses every 10 years. Adults with an unknown or incomplete history of completing a 3-dose immunization series with Td-containing vaccines should begin or complete a primary immunization series including a Tdap dose. Adults should receive a Td booster every 10 years.  Varicella vaccine. An adult without evidence of immunity to varicella should receive 2 doses or a second dose if she has previously received 1 dose. Pregnant females who do not have evidence of immunity should receive the first dose after pregnancy. This first dose should be obtained before leaving the health care facility. The second dose should be obtained 4-8 weeks after the first dose.  Human papillomavirus (HPV) vaccine. Females aged 13-26 years who have not received the vaccine previously should obtain the 3-dose series. The vaccine is not recommended for use in pregnant females. However, pregnancy testing is not needed before receiving a dose. If a female is found to be pregnant after receiving a dose, no treatment is needed. In that case, the remaining doses should be delayed until after the pregnancy. Immunization is recommended for any person with an immunocompromised condition through the age of 26 years if she did not get any or all doses earlier. During the 3-dose series, the second dose should be obtained 4-8 weeks after the first dose. The third dose should be obtained 24 weeks after the first dose and 16 weeks after the second dose.  Zoster vaccine. One dose is recommended for adults aged 60 years or older unless certain conditions are  present.  Measles, mumps, and rubella (MMR) vaccine. Adults born before 1957 generally are considered immune to measles and mumps. Adults born in 1957 or later should have 1 or more doses of MMR vaccine unless there is a contraindication to the vaccine or there is laboratory evidence of immunity to   each of the three diseases. A routine second dose of MMR vaccine should be obtained at least 28 days after the first dose for students attending postsecondary schools, health care workers, or international travelers. People who received inactivated measles vaccine or an unknown type of measles vaccine during 1963-1967 should receive 2 doses of MMR vaccine. People who received inactivated mumps vaccine or an unknown type of mumps vaccine before 1979 and are at high risk for mumps infection should consider immunization with 2 doses of MMR vaccine. For females of childbearing age, rubella immunity should be determined. If there is no evidence of immunity, females who are not pregnant should be vaccinated. If there is no evidence of immunity, females who are pregnant should delay immunization until after pregnancy. Unvaccinated health care workers born before 1957 who lack laboratory evidence of measles, mumps, or rubella immunity or laboratory confirmation of disease should consider measles and mumps immunization with 2 doses of MMR vaccine or rubella immunization with 1 dose of MMR vaccine.  Pneumococcal 13-valent conjugate (PCV13) vaccine. When indicated, a person who is uncertain of her immunization history and has no record of immunization should receive the PCV13 vaccine. An adult aged 19 years or older who has certain medical conditions and has not been previously immunized should receive 1 dose of PCV13 vaccine. This PCV13 should be followed with a dose of pneumococcal polysaccharide (PPSV23) vaccine. The PPSV23 vaccine dose should be obtained at least 8 weeks after the dose of PCV13 vaccine. An adult aged 19  years or older who has certain medical conditions and previously received 1 or more doses of PPSV23 vaccine should receive 1 dose of PCV13. The PCV13 vaccine dose should be obtained 1 or more years after the last PPSV23 vaccine dose.  Pneumococcal polysaccharide (PPSV23) vaccine. When PCV13 is also indicated, PCV13 should be obtained first. All adults aged 65 years and older should be immunized. An adult younger than age 65 years who has certain medical conditions should be immunized. Any person who resides in a nursing home or long-term care facility should be immunized. An adult smoker should be immunized. People with an immunocompromised condition and certain other conditions should receive both PCV13 and PPSV23 vaccines. People with human immunodeficiency virus (HIV) infection should be immunized as soon as possible after diagnosis. Immunization during chemotherapy or radiation therapy should be avoided. Routine use of PPSV23 vaccine is not recommended for American Indians, [place] Natives, or people younger than 65 years unless there are medical conditions that require PPSV23 vaccine. When indicated, people who have unknown immunization and have no record of immunization should receive PPSV23 vaccine. One-time revaccination 5 years after the first dose of PPSV23 is recommended for people aged 19-64 years who have chronic kidney failure, nephrotic syndrome, asplenia, or immunocompromised conditions. People who received 1-2 doses of PPSV23 before age 65 years should receive another dose of PPSV23 vaccine at age 65 years or later if at least 5 years have passed since the previous dose. Doses of PPSV23 are not needed for people immunized with PPSV23 at or after age 65 years.  Meningococcal vaccine. Adults with asplenia or persistent complement component deficiencies should receive 2 doses of quadrivalent meningococcal conjugate (MenACWY-D) vaccine. The doses should be obtained at least 2 months apart.  Microbiologists working with certain meningococcal bacteria, military recruits, people at risk during an outbreak, and people who travel to or live in countries with a high rate of meningitis should be immunized. A first-year college student up through age   21 years who is living in a residence hall should receive a dose if she did not receive a dose on or after her 16th birthday. Adults who have certain high-risk conditions should receive one or more doses of vaccine.  Hepatitis A vaccine. Adults who wish to be protected from this disease, have certain high-risk conditions, work with hepatitis A-infected animals, work in hepatitis A research labs, or travel to or work in countries with a high rate of hepatitis A should be immunized. Adults who were previously unvaccinated and who anticipate close contact with an international adoptee during the first 60 days after arrival in the [place] from a country with a high rate of hepatitis A should be immunized.  Hepatitis B vaccine. Adults who wish to be protected from this disease, have certain high-risk conditions, may be exposed to blood or other infectious body fluids, are household contacts or sex partners of hepatitis B positive people, are clients or workers in certain care facilities, or travel to or work in countries with a high rate of hepatitis B should be immunized.  Haemophilus influenzae type b (Hib) vaccine. A previously unvaccinated person with asplenia or sickle cell disease or having a scheduled splenectomy should receive 1 dose of Hib vaccine. Regardless of previous immunization, a recipient of a hematopoietic stem cell transplant should receive a 3-dose series 6-12 months after her successful transplant. Hib vaccine is not recommended for adults with HIV infection. Preventive Services / Frequency Ages 43 to 39years  Blood pressure check.** / Every 1 to 2 years.  Lipid and cholesterol check.** / Every 5 years beginning at age  75.  Clinical breast exam.** / Every 3 years for women in their 32s and 74s.  BRCA-related cancer risk assessment.** / For women who have family members with a BRCA-related cancer (breast, ovarian, tubal, or peritoneal cancers).  Pap test.** / Every 2 years from ages 65 through 91. Every 3 years starting at age 34 through age 93 or 72 with a history of 3 consecutive normal Pap tests.  HPV screening.** / Every 3 years from ages 46 through ages 53 to 26 with a history of 3 consecutive normal Pap tests.  Hepatitis C blood test.** / For any individual with known risks for hepatitis C.  Skin self-exam. / Monthly.  Influenza vaccine. / Every year.  Tetanus, diphtheria, and acellular pertussis (Tdap, Td) vaccine.** / Consult your health care provider. Pregnant women should receive 1 dose of Tdap vaccine during each pregnancy. 1 dose of Td every 10 years.  Varicella vaccine.** / Consult your health care provider. Pregnant females who do not have evidence of immunity should receive the first dose after pregnancy.  HPV vaccine. / 3 doses over 6 months, if 70 and younger. The vaccine is not recommended for use in pregnant females. However, pregnancy testing is not needed before receiving a dose.  Measles, mumps, rubella (MMR) vaccine.** / You need at least 1 dose of MMR if you were born in 1957 or later. You may also need a 2nd dose. For females of childbearing age, rubella immunity should be determined. If there is no evidence of immunity, females who are not pregnant should be vaccinated. If there is no evidence of immunity, females who are pregnant should delay immunization until after pregnancy.  Pneumococcal 13-valent conjugate (PCV13) vaccine.** / Consult your health care provider.  Pneumococcal polysaccharide (PPSV23) vaccine.** / 1 to 2 doses if you smoke cigarettes or if you have certain conditions.  Meningococcal vaccine.** /  1 dose if you are age 70 to 51 years and a [place] living in a residence hall, or have one of several medical conditions, you need to get vaccinated against meningococcal disease. You may also need additional booster doses.  Hepatitis A vaccine.** / Consult your health care provider.  Hepatitis B vaccine.** / Consult your health care provider.  Haemophilus influenzae type b (Hib) vaccine.** / Consult your health care provider. Ages 40 to 64years  Blood pressure check.** / Every 1 to 2 years.  Lipid and cholesterol check.** / Every 5 years beginning at age 58 years.  Lung cancer screening. / Every year if you are aged 56-80 years and have a 30-pack-year history of smoking and currently smoke or have quit within the past 15 years. Yearly screening is stopped once you have quit smoking for at least 15 years or develop a health problem that would prevent you from having lung cancer treatment.  Clinical breast exam.** / Every year after age 35 years.  BRCA-related cancer risk assessment.** / For women who have family members with a BRCA-related cancer (breast, ovarian, tubal, or peritoneal cancers).  Mammogram.** / Every year beginning at age 109 years and continuing for as long as you are in good health. Consult with your health care provider.  Pap test.** / Every 3 years starting at age 44 years through age 94 or 70 years with a history of 3 consecutive normal Pap tests.  HPV screening.** / Every 3 years from ages 109 years through ages 50 to 30 years with a history of 3 consecutive normal Pap tests.  Fecal occult blood test (FOBT) of stool. / Every year beginning at age 73 years and continuing until age 59 years. You may not need to do this test if you get a colonoscopy every 10 years.  Flexible sigmoidoscopy or colonoscopy.** / Every 5 years for a flexible sigmoidoscopy or every 10 years for a colonoscopy beginning at age 68 years and continuing until age 12 years.  Hepatitis C blood test.** / For all people born from [birth year] through  1965 and any individual with known risks for hepatitis C.  Skin self-exam. / Monthly.  Influenza vaccine. / Every year.  Tetanus, diphtheria, and acellular pertussis (Tdap/Td) vaccine.** / Consult your health care provider. Pregnant women should receive 1 dose of Tdap vaccine during each pregnancy. 1 dose of Td every 10 years.  Varicella vaccine.** / Consult your health care provider. Pregnant females who do not have evidence of immunity should receive the first dose after pregnancy.  Zoster vaccine.** / 1 dose for adults aged 2 years or older.  Measles, mumps, rubella (MMR) vaccine.** / You need at least 1 dose of MMR if you were born in 1957 or later. You may also need a 2nd dose. For females of childbearing age, rubella immunity should be determined. If there is no evidence of immunity, females who are not pregnant should be vaccinated. If there is no evidence of immunity, females who are pregnant should delay immunization until after pregnancy.  Pneumococcal 13-valent conjugate (PCV13) vaccine.** / Consult your health care provider.  Pneumococcal polysaccharide (PPSV23) vaccine.** / 1 to 2 doses if you smoke cigarettes or if you have certain conditions.  Meningococcal vaccine.** / Consult your health care provider.  Hepatitis A vaccine.** / Consult your health care provider.  Hepatitis B vaccine.** / Consult your health care provider.  Haemophilus influenzae type b (Hib) vaccine.** / Consult your health care provider. Ages 48 years  and over  Blood pressure check.** / Every 1 to 2 years.  Lipid and cholesterol check.** / Every 5 years beginning at age 84 years.  Lung cancer screening. / Every year if you are aged 50-80 years and have a 30-pack-year history of smoking and currently smoke or have quit within the past 15 years. Yearly screening is stopped once you have quit smoking for at least 15 years or develop a health problem that would prevent you from having lung cancer  treatment.  Clinical breast exam.** / Every year after age 24 years.  BRCA-related cancer risk assessment.** / For women who have family members with a BRCA-related cancer (breast, ovarian, tubal, or peritoneal cancers).  Mammogram.** / Every year beginning at age 14 years and continuing for as long as you are in good health. Consult with your health care provider.  Pap test.** / Every 3 years starting at age 17 years through age 31 or 74 years with 3 consecutive normal Pap tests. Testing can be stopped between 65 and 70 years with 3 consecutive normal Pap tests and no abnormal Pap or HPV tests in the past 10 years.  HPV screening.** / Every 3 years from ages 30 years through ages 70 or 28 years with a history of 3 consecutive normal Pap tests. Testing can be stopped between 65 and 70 years with 3 consecutive normal Pap tests and no abnormal Pap or HPV tests in the past 10 years.  Fecal occult blood test (FOBT) of stool. / Every year beginning at age 64 years and continuing until age 92 years. You may not need to do this test if you get a colonoscopy every 10 years.  Flexible sigmoidoscopy or colonoscopy.** / Every 5 years for a flexible sigmoidoscopy or every 10 years for a colonoscopy beginning at age 73 years and continuing until age 39 years.  Hepatitis C blood test.** / For all people born from [birth year] through 1966 and any individual with known risks for hepatitis C.  Osteoporosis screening.** / A one-time screening for women ages 35 years and over and women at risk for fractures or osteoporosis.  Skin self-exam. / Monthly.  Influenza vaccine. / Every year.  Tetanus, diphtheria, and acellular pertussis (Tdap/Td) vaccine.** / 1 dose of Td every 10 years.  Varicella vaccine.** / Consult your health care provider.  Zoster vaccine.** / 1 dose for adults aged 59 years or older.  Pneumococcal 13-valent conjugate (PCV13) vaccine.** / Consult your health care provider.  Pneumococcal  polysaccharide (PPSV23) vaccine.** / 1 dose for all adults aged 8 years and older.  Meningococcal vaccine.** / Consult your health care provider.  Hepatitis A vaccine.** / Consult your health care provider.  Hepatitis B vaccine.** / Consult your health care provider.  Haemophilus influenzae type b (Hib) vaccine.** / Consult your health care provider. ** Family history and personal history of risk and conditions may change your health care provider's recommendations. Document Released: 11/25/2001 Document Revised: 10/04/2013 Document Reviewed: 02/24/2011 [facility] Patient Information 2015 [place], [place]. This information is not intended to replace advice given to you by your health care provider. Make sure you discuss any questions you have with your health care provider.

## 2014-04-04 LAB — POCT URINALYSIS DIPSTICK
BILIRUBIN UA: NEGATIVE
GLUCOSE UA: NEGATIVE
Ketones, UA: NEGATIVE
NITRITE UA: NEGATIVE
RBC UA: NEGATIVE
SPEC GRAV UA: 1.015
Urobilinogen, UA: 0.2
pH, UA: 7.5

## 2014-04-04 LAB — CYTOLOGY - PAP

## 2014-04-04 NOTE — Addendum Note (Signed)
Addended by: BAYNES, ANGELA M on: 04/04/2014 04:05 PM   Modules accepted: Orders  

## 2014-04-04 NOTE — Addendum Note (Signed)
Addended by: Verdie ShireBAYNES, ANGELA M on: 04/04/2014 04:05 PM   Modules accepted: Orders

## 2014-04-06 LAB — URINE CULTURE
COLONY COUNT: NO GROWTH
ORGANISM ID, BACTERIA: NO GROWTH

## 2014-04-13 ENCOUNTER — Encounter: Payer: Self-pay | Admitting: Family Medicine

## 2014-04-13 DIAGNOSIS — E2839 Other primary ovarian failure: Secondary | ICD-10-CM

## 2014-04-13 DIAGNOSIS — M8580 Other specified disorders of bone density and structure, unspecified site: Secondary | ICD-10-CM

## 2014-04-17 ENCOUNTER — Other Ambulatory Visit: Payer: Self-pay

## 2014-04-17 DIAGNOSIS — M8580 Other specified disorders of bone density and structure, unspecified site: Secondary | ICD-10-CM

## 2014-04-17 DIAGNOSIS — E2839 Other primary ovarian failure: Secondary | ICD-10-CM

## 2014-04-17 NOTE — Addendum Note (Signed)
Addended by: Arnette NorrisPAYNE, Rayane Gallardo P on: 04/17/2014 09:56 AM   Modules accepted: Orders

## 2014-05-22 ENCOUNTER — Other Ambulatory Visit: Payer: Self-pay | Admitting: Family Medicine

## 2014-05-22 DIAGNOSIS — Z1231 Encounter for screening mammogram for malignant neoplasm of breast: Secondary | ICD-10-CM

## 2014-08-07 ENCOUNTER — Ambulatory Visit (HOSPITAL_BASED_OUTPATIENT_CLINIC_OR_DEPARTMENT_OTHER)
Admission: RE | Admit: 2014-08-07 | Discharge: 2014-08-07 | Disposition: A | Discharge status: Home / Self Care | Payer: PRIVATE HEALTH INSURANCE | Source: Ambulatory Visit | Attending: Family Medicine | Admitting: Family Medicine

## 2014-08-07 DIAGNOSIS — Z1231 Encounter for screening mammogram for malignant neoplasm of breast: Secondary | ICD-10-CM | POA: Diagnosis present

## 2015-06-19 ENCOUNTER — Other Ambulatory Visit (HOSPITAL_BASED_OUTPATIENT_CLINIC_OR_DEPARTMENT_OTHER): Payer: Self-pay | Admitting: Internal Medicine

## 2015-06-19 DIAGNOSIS — Z1231 Encounter for screening mammogram for malignant neoplasm of breast: Secondary | ICD-10-CM

## 2015-08-14 ENCOUNTER — Ambulatory Visit (HOSPITAL_BASED_OUTPATIENT_CLINIC_OR_DEPARTMENT_OTHER)
Admission: RE | Admit: 2015-08-14 | Discharge: 2015-08-14 | Disposition: A | Discharge status: Home / Self Care | Payer: PRIVATE HEALTH INSURANCE | Source: Ambulatory Visit | Attending: Internal Medicine | Admitting: Internal Medicine

## 2015-08-14 DIAGNOSIS — Z1231 Encounter for screening mammogram for malignant neoplasm of breast: Secondary | ICD-10-CM | POA: Insufficient documentation

## 2016-03-19 ENCOUNTER — Encounter: Payer: Self-pay | Admitting: Family Medicine

## 2018-06-21 DIAGNOSIS — Z23 Encounter for immunization: Secondary | ICD-10-CM | POA: Diagnosis not present

## 2018-07-08 DIAGNOSIS — M810 Age-related osteoporosis without current pathological fracture: Secondary | ICD-10-CM | POA: Diagnosis not present

## 2018-07-27 ENCOUNTER — Other Ambulatory Visit: Payer: Self-pay | Admitting: Internal Medicine

## 2018-07-27 DIAGNOSIS — Z1231 Encounter for screening mammogram for malignant neoplasm of breast: Secondary | ICD-10-CM

## 2018-08-02 DIAGNOSIS — Z01818 Encounter for other preprocedural examination: Secondary | ICD-10-CM | POA: Diagnosis not present

## 2018-08-06 DIAGNOSIS — Z23 Encounter for immunization: Secondary | ICD-10-CM | POA: Diagnosis not present

## 2018-09-02 ENCOUNTER — Ambulatory Visit
Admission: RE | Admit: 2018-09-02 | Discharge: 2018-09-02 | Disposition: A | Discharge status: Home / Self Care | Payer: BLUE CROSS/BLUE SHIELD | Source: Ambulatory Visit | Attending: Internal Medicine | Admitting: Internal Medicine

## 2018-09-02 DIAGNOSIS — Z1231 Encounter for screening mammogram for malignant neoplasm of breast: Secondary | ICD-10-CM | POA: Diagnosis not present

## 2018-09-07 DIAGNOSIS — K648 Other hemorrhoids: Secondary | ICD-10-CM | POA: Diagnosis not present

## 2018-09-07 DIAGNOSIS — K635 Polyp of colon: Secondary | ICD-10-CM | POA: Diagnosis not present

## 2018-09-07 DIAGNOSIS — K644 Residual hemorrhoidal skin tags: Secondary | ICD-10-CM | POA: Diagnosis not present

## 2018-09-07 DIAGNOSIS — Z86010 Personal history of colonic polyps: Secondary | ICD-10-CM | POA: Diagnosis not present

## 2018-09-07 DIAGNOSIS — D120 Benign neoplasm of cecum: Secondary | ICD-10-CM | POA: Diagnosis not present

## 2018-09-07 DIAGNOSIS — D122 Benign neoplasm of ascending colon: Secondary | ICD-10-CM | POA: Diagnosis not present

## 2018-09-14 DIAGNOSIS — K635 Polyp of colon: Secondary | ICD-10-CM | POA: Diagnosis not present

## 2018-09-14 DIAGNOSIS — D120 Benign neoplasm of cecum: Secondary | ICD-10-CM | POA: Diagnosis not present

## 2018-09-14 DIAGNOSIS — D122 Benign neoplasm of ascending colon: Secondary | ICD-10-CM | POA: Diagnosis not present

## 2019-01-31 ENCOUNTER — Other Ambulatory Visit: Payer: Self-pay | Admitting: Family Medicine

## 2019-01-31 ENCOUNTER — Other Ambulatory Visit: Payer: Self-pay | Admitting: Internal Medicine

## 2019-01-31 DIAGNOSIS — Z1231 Encounter for screening mammogram for malignant neoplasm of breast: Secondary | ICD-10-CM

## 2019-03-22 DIAGNOSIS — H52203 Unspecified astigmatism, bilateral: Secondary | ICD-10-CM | POA: Diagnosis not present

## 2019-03-22 DIAGNOSIS — H16223 Keratoconjunctivitis sicca, not specified as Sjogren's, bilateral: Secondary | ICD-10-CM | POA: Diagnosis not present

## 2019-03-22 DIAGNOSIS — H2513 Age-related nuclear cataract, bilateral: Secondary | ICD-10-CM | POA: Diagnosis not present

## 2019-03-22 DIAGNOSIS — H5203 Hypermetropia, bilateral: Secondary | ICD-10-CM | POA: Diagnosis not present

## 2019-03-22 DIAGNOSIS — H524 Presbyopia: Secondary | ICD-10-CM | POA: Diagnosis not present

## 2019-03-22 DIAGNOSIS — H538 Other visual disturbances: Secondary | ICD-10-CM | POA: Diagnosis not present

## 2019-03-22 DIAGNOSIS — H43393 Other vitreous opacities, bilateral: Secondary | ICD-10-CM | POA: Diagnosis not present

## 2019-05-04 DIAGNOSIS — M810 Age-related osteoporosis without current pathological fracture: Secondary | ICD-10-CM | POA: Diagnosis not present

## 2019-05-11 DIAGNOSIS — M810 Age-related osteoporosis without current pathological fracture: Secondary | ICD-10-CM | POA: Diagnosis not present

## 2019-05-20 DIAGNOSIS — M810 Age-related osteoporosis without current pathological fracture: Secondary | ICD-10-CM | POA: Diagnosis not present

## 2019-05-20 DIAGNOSIS — L239 Allergic contact dermatitis, unspecified cause: Secondary | ICD-10-CM | POA: Diagnosis not present

## 2019-05-20 DIAGNOSIS — D709 Neutropenia, unspecified: Secondary | ICD-10-CM | POA: Diagnosis not present

## 2019-05-20 DIAGNOSIS — E782 Mixed hyperlipidemia: Secondary | ICD-10-CM | POA: Diagnosis not present

## 2019-08-24 ENCOUNTER — Other Ambulatory Visit: Payer: Self-pay | Admitting: Internal Medicine

## 2019-08-24 DIAGNOSIS — Z1231 Encounter for screening mammogram for malignant neoplasm of breast: Secondary | ICD-10-CM | POA: Diagnosis not present

## 2019-08-24 DIAGNOSIS — Z0000 Encounter for general adult medical examination without abnormal findings: Secondary | ICD-10-CM | POA: Diagnosis not present

## 2019-08-24 DIAGNOSIS — M810 Age-related osteoporosis without current pathological fracture: Secondary | ICD-10-CM | POA: Diagnosis not present

## 2019-08-24 DIAGNOSIS — R5381 Other malaise: Secondary | ICD-10-CM

## 2019-08-24 DIAGNOSIS — D709 Neutropenia, unspecified: Secondary | ICD-10-CM | POA: Diagnosis not present

## 2019-08-24 DIAGNOSIS — E663 Overweight: Secondary | ICD-10-CM | POA: Diagnosis not present

## 2019-08-24 DIAGNOSIS — Z1211 Encounter for screening for malignant neoplasm of colon: Secondary | ICD-10-CM | POA: Diagnosis not present

## 2019-08-24 DIAGNOSIS — E782 Mixed hyperlipidemia: Secondary | ICD-10-CM | POA: Diagnosis not present

## 2019-08-24 DIAGNOSIS — Z23 Encounter for immunization: Secondary | ICD-10-CM | POA: Diagnosis not present

## 2019-08-24 DIAGNOSIS — Z136 Encounter for screening for cardiovascular disorders: Secondary | ICD-10-CM | POA: Diagnosis not present

## 2019-09-05 ENCOUNTER — Other Ambulatory Visit: Payer: Self-pay | Admitting: Internal Medicine

## 2019-09-05 ENCOUNTER — Other Ambulatory Visit: Payer: Self-pay

## 2019-09-05 ENCOUNTER — Ambulatory Visit
Admission: RE | Admit: 2019-09-05 | Discharge: 2019-09-05 | Disposition: A | Discharge status: Home / Self Care | Payer: BC Managed Care – PPO | Source: Ambulatory Visit | Attending: Internal Medicine | Admitting: Internal Medicine

## 2019-09-05 DIAGNOSIS — Z1231 Encounter for screening mammogram for malignant neoplasm of breast: Secondary | ICD-10-CM | POA: Diagnosis not present

## 2019-09-05 DIAGNOSIS — M810 Age-related osteoporosis without current pathological fracture: Secondary | ICD-10-CM | POA: Diagnosis not present

## 2019-10-25 DIAGNOSIS — Z6825 Body mass index (BMI) 25.0-25.9, adult: Secondary | ICD-10-CM | POA: Diagnosis not present

## 2019-10-25 DIAGNOSIS — M8580 Other specified disorders of bone density and structure, unspecified site: Secondary | ICD-10-CM | POA: Diagnosis not present

## 2019-10-25 DIAGNOSIS — Z7189 Other specified counseling: Secondary | ICD-10-CM | POA: Diagnosis not present

## 2020-04-19 DIAGNOSIS — H16223 Keratoconjunctivitis sicca, not specified as Sjogren's, bilateral: Secondary | ICD-10-CM | POA: Diagnosis not present

## 2020-04-19 DIAGNOSIS — H2513 Age-related nuclear cataract, bilateral: Secondary | ICD-10-CM | POA: Diagnosis not present

## 2020-08-01 ENCOUNTER — Other Ambulatory Visit: Payer: Self-pay | Admitting: Internal Medicine

## 2020-08-01 DIAGNOSIS — Z1231 Encounter for screening mammogram for malignant neoplasm of breast: Secondary | ICD-10-CM

## 2020-09-11 ENCOUNTER — Other Ambulatory Visit: Payer: Self-pay

## 2020-09-11 ENCOUNTER — Ambulatory Visit
Admission: RE | Admit: 2020-09-11 | Discharge: 2020-09-11 | Disposition: A | Discharge status: Home / Self Care | Payer: BC Managed Care – PPO | Source: Ambulatory Visit | Attending: Internal Medicine | Admitting: Internal Medicine

## 2020-09-11 DIAGNOSIS — Z1231 Encounter for screening mammogram for malignant neoplasm of breast: Secondary | ICD-10-CM | POA: Diagnosis not present

## 2021-03-22 ENCOUNTER — Other Ambulatory Visit: Payer: Self-pay | Admitting: Internal Medicine

## 2021-03-22 DIAGNOSIS — Z1231 Encounter for screening mammogram for malignant neoplasm of breast: Secondary | ICD-10-CM

## 2021-09-13 ENCOUNTER — Ambulatory Visit
Admission: RE | Admit: 2021-09-13 | Discharge: 2021-09-13 | Disposition: A | Discharge status: Home / Self Care | Payer: BC Managed Care – PPO | Source: Ambulatory Visit | Attending: Internal Medicine | Admitting: Internal Medicine

## 2021-09-13 DIAGNOSIS — Z1231 Encounter for screening mammogram for malignant neoplasm of breast: Secondary | ICD-10-CM | POA: Diagnosis not present

## 2021-10-21 DIAGNOSIS — M8589 Other specified disorders of bone density and structure, multiple sites: Secondary | ICD-10-CM | POA: Diagnosis not present

## 2022-04-02 ENCOUNTER — Other Ambulatory Visit: Payer: Self-pay | Admitting: Internal Medicine

## 2022-04-02 DIAGNOSIS — Z1231 Encounter for screening mammogram for malignant neoplasm of breast: Secondary | ICD-10-CM

## 2022-04-24 DIAGNOSIS — H2513 Age-related nuclear cataract, bilateral: Secondary | ICD-10-CM | POA: Diagnosis not present

## 2022-04-24 DIAGNOSIS — H43393 Other vitreous opacities, bilateral: Secondary | ICD-10-CM | POA: Diagnosis not present

## 2022-04-24 DIAGNOSIS — H5203 Hypermetropia, bilateral: Secondary | ICD-10-CM | POA: Diagnosis not present

## 2022-04-24 DIAGNOSIS — H16223 Keratoconjunctivitis sicca, not specified as Sjogren's, bilateral: Secondary | ICD-10-CM | POA: Diagnosis not present

## 2022-08-13 DIAGNOSIS — Z0000 Encounter for general adult medical examination without abnormal findings: Secondary | ICD-10-CM | POA: Diagnosis not present

## 2022-08-13 DIAGNOSIS — Z1231 Encounter for screening mammogram for malignant neoplasm of breast: Secondary | ICD-10-CM | POA: Diagnosis not present

## 2022-08-13 DIAGNOSIS — E782 Mixed hyperlipidemia: Secondary | ICD-10-CM | POA: Diagnosis not present

## 2022-08-13 DIAGNOSIS — Z1211 Encounter for screening for malignant neoplasm of colon: Secondary | ICD-10-CM | POA: Diagnosis not present

## 2022-08-13 DIAGNOSIS — D709 Neutropenia, unspecified: Secondary | ICD-10-CM | POA: Diagnosis not present

## 2022-08-13 DIAGNOSIS — M810 Age-related osteoporosis without current pathological fracture: Secondary | ICD-10-CM | POA: Diagnosis not present

## 2022-08-14 DIAGNOSIS — Z23 Encounter for immunization: Secondary | ICD-10-CM | POA: Diagnosis not present

## 2022-08-21 DIAGNOSIS — Z23 Encounter for immunization: Secondary | ICD-10-CM | POA: Diagnosis not present

## 2022-08-28 DIAGNOSIS — Z23 Encounter for immunization: Secondary | ICD-10-CM | POA: Diagnosis not present

## 2022-09-09 DIAGNOSIS — Z23 Encounter for immunization: Secondary | ICD-10-CM | POA: Diagnosis not present

## 2022-09-15 ENCOUNTER — Ambulatory Visit
Admission: RE | Admit: 2022-09-15 | Discharge: 2022-09-15 | Disposition: A | Discharge status: Home / Self Care | Payer: BC Managed Care – PPO | Source: Ambulatory Visit | Attending: Internal Medicine | Admitting: Internal Medicine

## 2022-09-15 DIAGNOSIS — Z1231 Encounter for screening mammogram for malignant neoplasm of breast: Secondary | ICD-10-CM | POA: Diagnosis not present

## 2022-12-30 ENCOUNTER — Other Ambulatory Visit: Payer: Self-pay | Admitting: Internal Medicine

## 2022-12-30 DIAGNOSIS — Z1231 Encounter for screening mammogram for malignant neoplasm of breast: Secondary | ICD-10-CM

## 2023-01-07 DIAGNOSIS — Z23 Encounter for immunization: Secondary | ICD-10-CM | POA: Diagnosis not present

## 2023-04-28 DIAGNOSIS — H52223 Regular astigmatism, bilateral: Secondary | ICD-10-CM | POA: Diagnosis not present

## 2023-04-28 DIAGNOSIS — H2513 Age-related nuclear cataract, bilateral: Secondary | ICD-10-CM | POA: Diagnosis not present

## 2023-04-28 DIAGNOSIS — H43393 Other vitreous opacities, bilateral: Secondary | ICD-10-CM | POA: Diagnosis not present

## 2023-04-28 DIAGNOSIS — H04123 Dry eye syndrome of bilateral lacrimal glands: Secondary | ICD-10-CM | POA: Diagnosis not present

## 2023-07-30 DIAGNOSIS — Z23 Encounter for immunization: Secondary | ICD-10-CM | POA: Diagnosis not present

## 2023-08-27 DIAGNOSIS — Z23 Encounter for immunization: Secondary | ICD-10-CM | POA: Diagnosis not present

## 2023-09-09 DIAGNOSIS — D123 Benign neoplasm of transverse colon: Secondary | ICD-10-CM | POA: Diagnosis not present

## 2023-09-09 DIAGNOSIS — Z860101 Personal history of adenomatous and serrated colon polyps: Secondary | ICD-10-CM | POA: Diagnosis not present

## 2023-09-09 DIAGNOSIS — D122 Benign neoplasm of ascending colon: Secondary | ICD-10-CM | POA: Diagnosis not present

## 2023-09-09 DIAGNOSIS — Z09 Encounter for follow-up examination after completed treatment for conditions other than malignant neoplasm: Secondary | ICD-10-CM | POA: Diagnosis not present

## 2023-09-17 ENCOUNTER — Ambulatory Visit
Admission: RE | Admit: 2023-09-17 | Discharge: 2023-09-17 | Disposition: A | Discharge status: Home / Self Care | Payer: BC Managed Care – PPO | Source: Ambulatory Visit | Attending: Internal Medicine | Admitting: Internal Medicine

## 2023-09-17 DIAGNOSIS — Z1231 Encounter for screening mammogram for malignant neoplasm of breast: Secondary | ICD-10-CM

## 2023-10-28 IMAGING — MG DIGITAL SCREENING BREAST BILAT IMPLANT W/ TOMO W/ CAD
9 of 12 series · 9 of 28 positions shown · non-contrast
Comparison: Previous exam(s).

CLINICAL DATA: Screening.

EXAM:
DIGITAL SCREENING BILATERAL MAMMOGRAM WITH IMPLANTS, CAD AND
TOMOSYNTHESIS
TECHNIQUE: Bilateral screening digital craniocaudal and mediolateral oblique
mammograms were obtained. Bilateral screening digital breast
tomosynthesis was performed. The images were evaluated with
computer-aided detection. Standard and/or implant displaced views
were performed.

[L MLO]
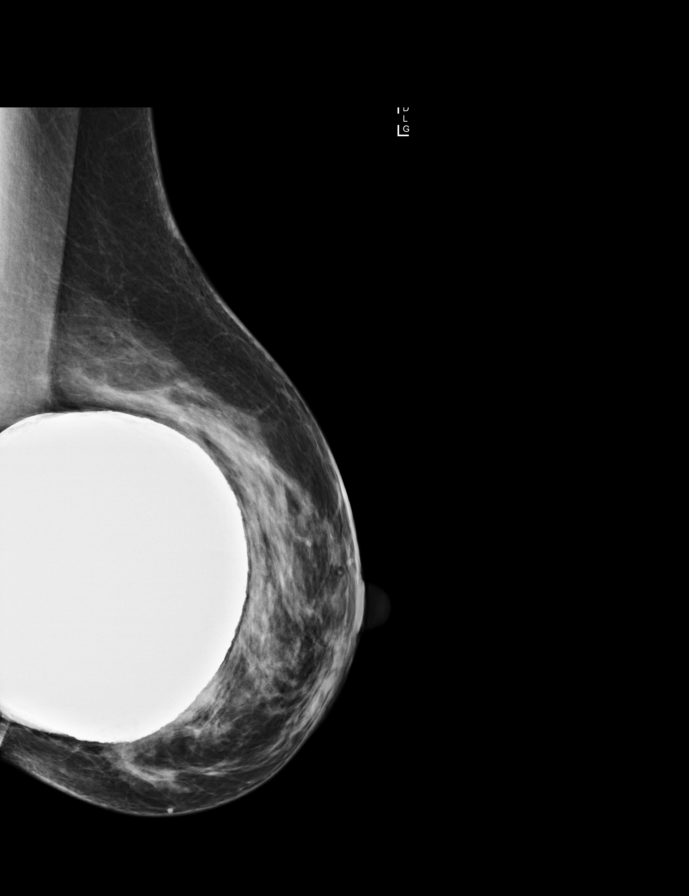

[L CC]
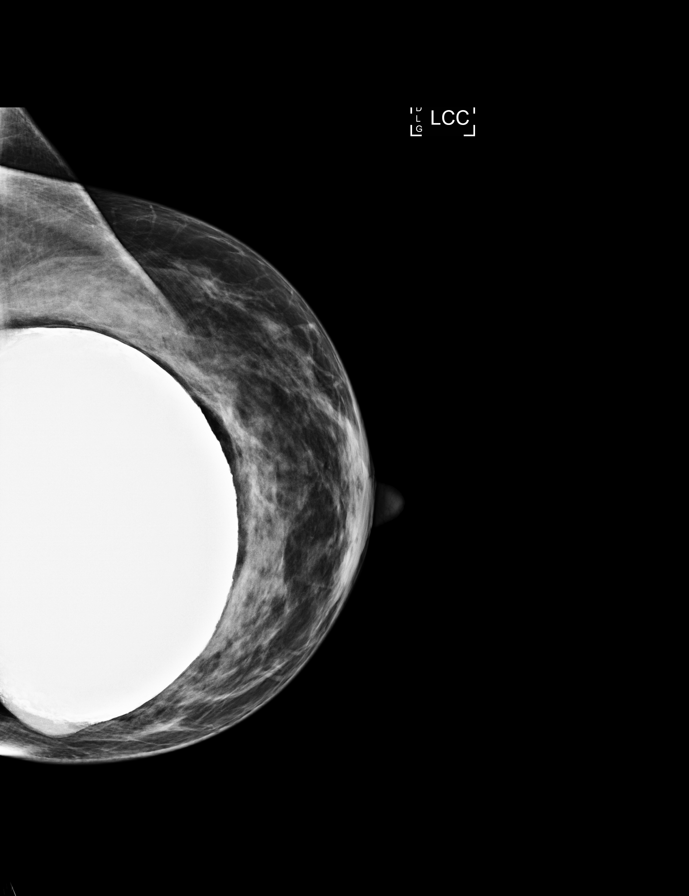

[R MLO]
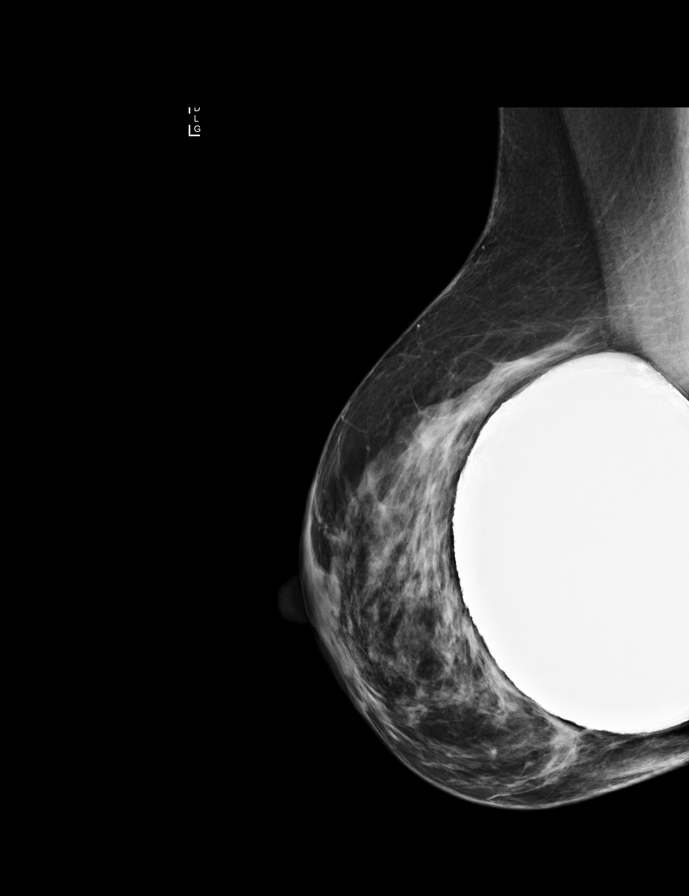

[R CC]
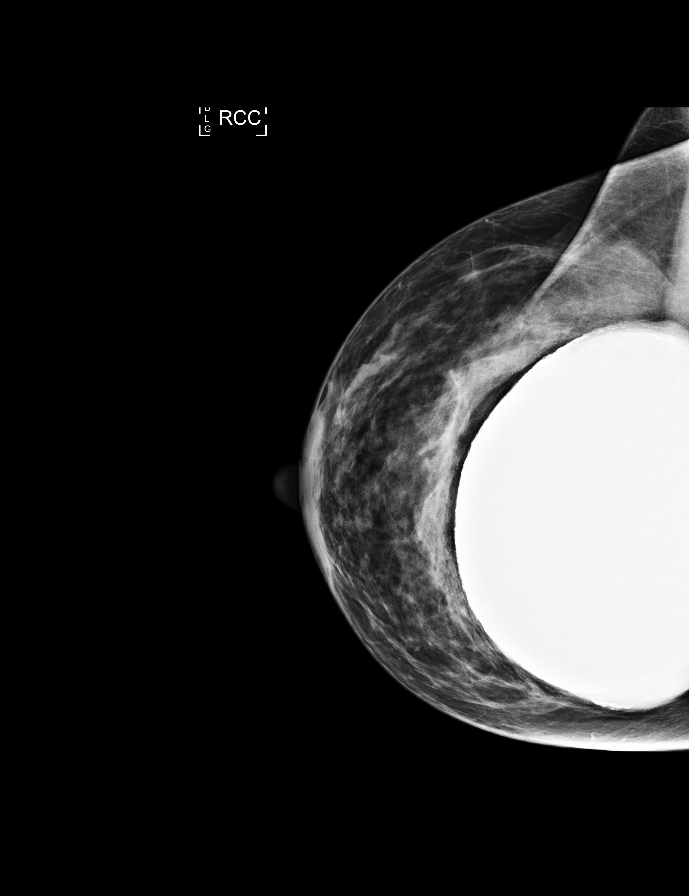

[L CC synth-2D]
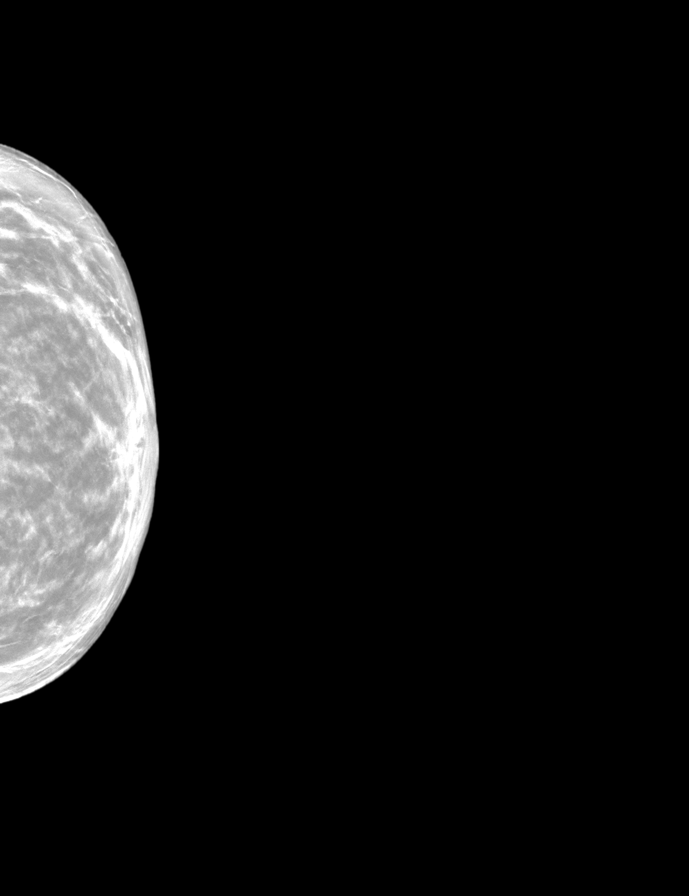

[R MLO synth-2D]
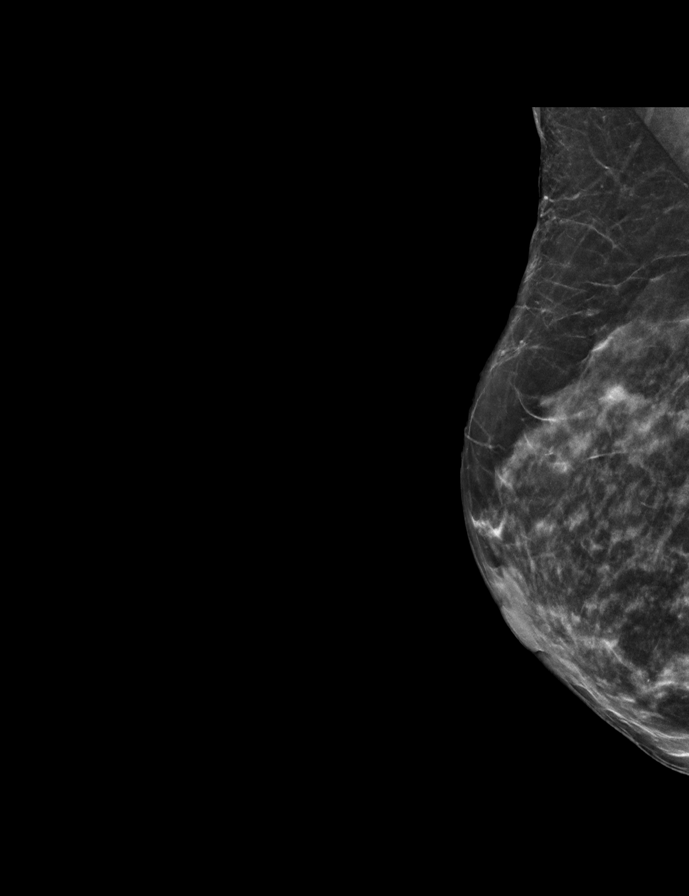

[R CC synth-2D]
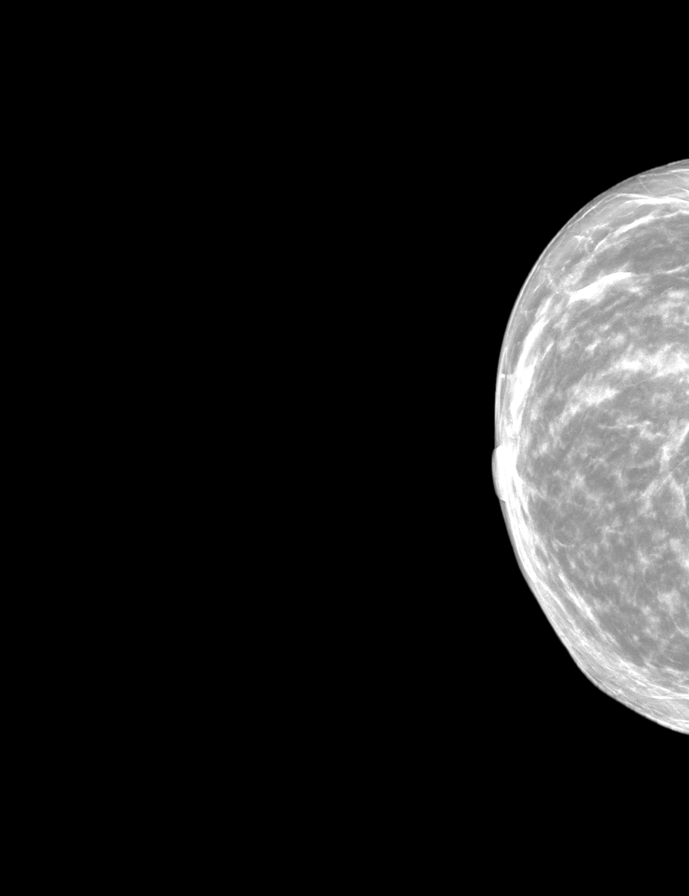

[L MLO synth-2D]
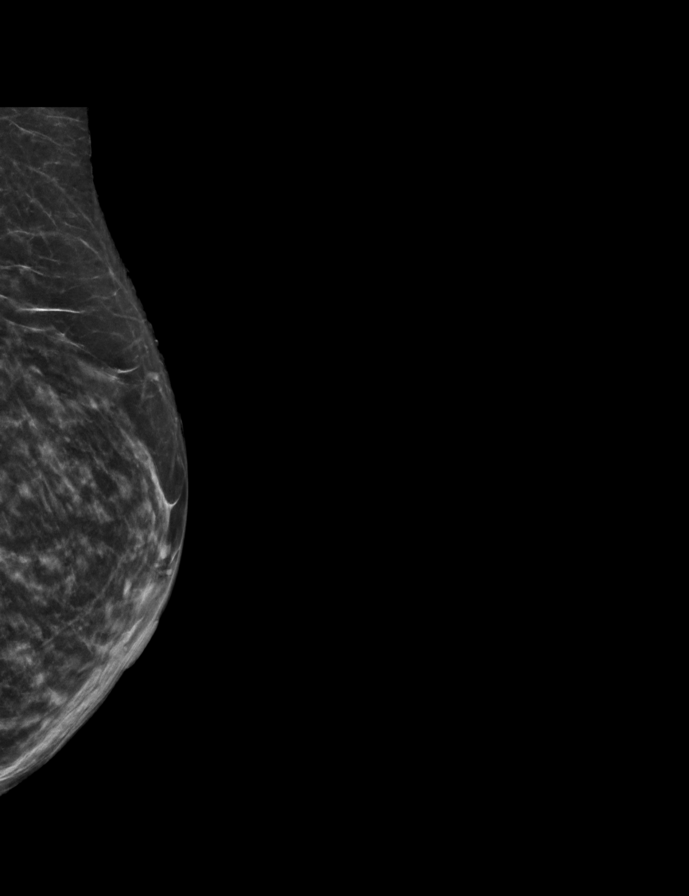

[R MLOID BREAST TOMOSYNTHESIS IMAGE tomo · tomo slice 29/57.0]
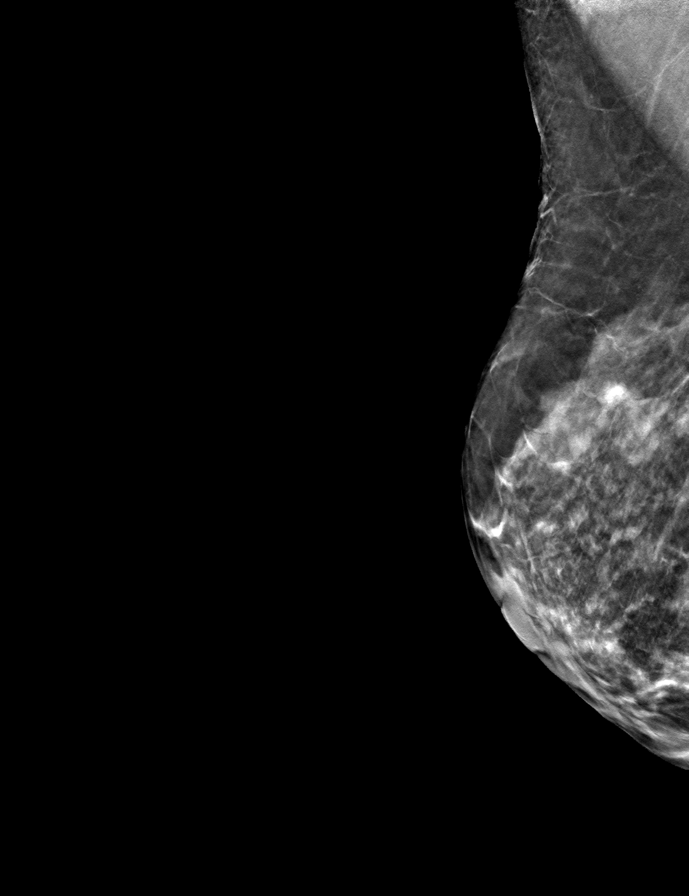

[9 of 28 positions shown; findings below may reference images not displayed]

ACR Breast Density Category c: The breast tissue is heterogeneously
dense, which may obscure small masses.
FINDINGS: The patient has prepectoral silicone implants. There are no findings
suspicious for malignancy. Stable implant contours.
IMPRESSION: No mammographic evidence of malignancy. A result letter of this
screening mammogram will be mailed directly to the patient.

RECOMMENDATION:
Screening mammogram in one year. (Code:JP-[id number])

BI-RADS CATEGORY  1:  Negative.

## 2024-04-12 DIAGNOSIS — M25561 Pain in right knee: Secondary | ICD-10-CM | POA: Diagnosis not present

## 2024-04-18 ENCOUNTER — Other Ambulatory Visit: Payer: Self-pay | Admitting: Internal Medicine

## 2024-04-18 DIAGNOSIS — Z1231 Encounter for screening mammogram for malignant neoplasm of breast: Secondary | ICD-10-CM

## 2024-08-16 DIAGNOSIS — Z23 Encounter for immunization: Secondary | ICD-10-CM | POA: Diagnosis not present

## 2024-09-19 ENCOUNTER — Ambulatory Visit
Admission: RE | Admit: 2024-09-19 | Discharge: 2024-09-19 | Disposition: A | Source: Ambulatory Visit | Attending: Internal Medicine | Admitting: Internal Medicine

## 2024-09-19 DIAGNOSIS — Z1231 Encounter for screening mammogram for malignant neoplasm of breast: Secondary | ICD-10-CM
